# Patient Record
Sex: Male | Born: 2000 | Race: White | Hispanic: No | Marital: Single | Smoking: Never smoker
Health system: Southern US, Community
[De-identification: ages and names within clinical notes are randomized; demographics above are authoritative.]

## PROBLEM LIST (undated history)

## (undated) DIAGNOSIS — L709 Acne, unspecified: Secondary | ICD-10-CM

## (undated) DIAGNOSIS — R010 Benign and innocent cardiac murmurs: Secondary | ICD-10-CM

## (undated) DIAGNOSIS — T7840XA Allergy, unspecified, initial encounter: Secondary | ICD-10-CM

## (undated) HISTORY — DX: Acne, unspecified: L70.9

## (undated) HISTORY — DX: Allergy, unspecified, initial encounter: T78.40XA

## (undated) HISTORY — DX: Benign and innocent cardiac murmurs: R01.0

---
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
## 2019-03-05 DIAGNOSIS — Z5181 Encounter for therapeutic drug level monitoring: Secondary | ICD-10-CM | POA: Diagnosis not present

## 2019-03-15 DIAGNOSIS — Z5181 Encounter for therapeutic drug level monitoring: Secondary | ICD-10-CM | POA: Diagnosis not present

## 2019-03-22 DIAGNOSIS — Z5181 Encounter for therapeutic drug level monitoring: Secondary | ICD-10-CM | POA: Diagnosis not present

## 2019-03-26 DIAGNOSIS — Z5181 Encounter for therapeutic drug level monitoring: Secondary | ICD-10-CM | POA: Diagnosis not present

## 2019-04-02 DIAGNOSIS — Z5181 Encounter for therapeutic drug level monitoring: Secondary | ICD-10-CM | POA: Diagnosis not present

## 2019-04-09 DIAGNOSIS — Z5181 Encounter for therapeutic drug level monitoring: Secondary | ICD-10-CM | POA: Diagnosis not present

## 2019-04-16 DIAGNOSIS — Z5181 Encounter for therapeutic drug level monitoring: Secondary | ICD-10-CM | POA: Diagnosis not present

## 2019-04-23 DIAGNOSIS — Z5181 Encounter for therapeutic drug level monitoring: Secondary | ICD-10-CM | POA: Diagnosis not present

## 2019-04-30 DIAGNOSIS — Z5181 Encounter for therapeutic drug level monitoring: Secondary | ICD-10-CM | POA: Diagnosis not present

## 2019-05-07 DIAGNOSIS — Z5181 Encounter for therapeutic drug level monitoring: Secondary | ICD-10-CM | POA: Diagnosis not present

## 2019-05-16 DIAGNOSIS — Z5181 Encounter for therapeutic drug level monitoring: Secondary | ICD-10-CM | POA: Diagnosis not present

## 2019-05-24 DIAGNOSIS — Z5181 Encounter for therapeutic drug level monitoring: Secondary | ICD-10-CM | POA: Diagnosis not present

## 2019-05-31 DIAGNOSIS — Z5181 Encounter for therapeutic drug level monitoring: Secondary | ICD-10-CM | POA: Diagnosis not present

## 2019-06-07 DIAGNOSIS — Z5181 Encounter for therapeutic drug level monitoring: Secondary | ICD-10-CM | POA: Diagnosis not present

## 2019-06-11 DIAGNOSIS — Z5181 Encounter for therapeutic drug level monitoring: Secondary | ICD-10-CM | POA: Diagnosis not present

## 2019-06-20 DIAGNOSIS — Z5181 Encounter for therapeutic drug level monitoring: Secondary | ICD-10-CM | POA: Diagnosis not present

## 2019-06-23 ENCOUNTER — Emergency Department (HOSPITAL_COMMUNITY): Payer: Medicaid Other

## 2019-06-23 ENCOUNTER — Encounter (HOSPITAL_COMMUNITY): Payer: Self-pay | Admitting: Emergency Medicine

## 2019-06-23 ENCOUNTER — Other Ambulatory Visit: Payer: Self-pay

## 2019-06-23 ENCOUNTER — Emergency Department (HOSPITAL_COMMUNITY)
Admission: EM | Admit: 2019-06-23 | Discharge: 2019-06-23 | Discharge status: Against Medical Advice | Payer: Medicaid Other | Attending: Emergency Medicine | Admitting: Emergency Medicine

## 2019-06-23 DIAGNOSIS — Y998 Other external cause status: Secondary | ICD-10-CM | POA: Diagnosis not present

## 2019-06-23 DIAGNOSIS — L539 Erythematous condition, unspecified: Secondary | ICD-10-CM | POA: Insufficient documentation

## 2019-06-23 DIAGNOSIS — Y9389 Activity, other specified: Secondary | ICD-10-CM | POA: Insufficient documentation

## 2019-06-23 DIAGNOSIS — Z5320 Procedure and treatment not carried out because of patient's decision for unspecified reasons: Secondary | ICD-10-CM | POA: Insufficient documentation

## 2019-06-23 DIAGNOSIS — F1210 Cannabis abuse, uncomplicated: Secondary | ICD-10-CM | POA: Diagnosis not present

## 2019-06-23 DIAGNOSIS — T148XXA Other injury of unspecified body region, initial encounter: Secondary | ICD-10-CM

## 2019-06-23 DIAGNOSIS — F1410 Cocaine abuse, uncomplicated: Secondary | ICD-10-CM | POA: Insufficient documentation

## 2019-06-23 DIAGNOSIS — F10920 Alcohol use, unspecified with intoxication, uncomplicated: Secondary | ICD-10-CM | POA: Insufficient documentation

## 2019-06-23 DIAGNOSIS — Y92410 Unspecified street and highway as the place of occurrence of the external cause: Secondary | ICD-10-CM | POA: Insufficient documentation

## 2019-06-23 DIAGNOSIS — Z041 Encounter for examination and observation following transport accident: Secondary | ICD-10-CM | POA: Diagnosis present

## 2019-06-23 DIAGNOSIS — S59911A Unspecified injury of right forearm, initial encounter: Secondary | ICD-10-CM | POA: Diagnosis not present

## 2019-06-23 LAB — RAPID URINE DRUG SCREEN, HOSP PERFORMED
Amphetamines: NOT DETECTED
Barbiturates: NOT DETECTED
Benzodiazepines: NOT DETECTED
Cocaine: POSITIVE — AB
Opiates: NOT DETECTED
Tetrahydrocannabinol: POSITIVE — AB
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
## 2019-06-28 DIAGNOSIS — Z5181 Encounter for therapeutic drug level monitoring: Secondary | ICD-10-CM | POA: Diagnosis not present

## 2019-07-05 DIAGNOSIS — Z5181 Encounter for therapeutic drug level monitoring: Secondary | ICD-10-CM | POA: Diagnosis not present

## 2019-07-12 DIAGNOSIS — Z5181 Encounter for therapeutic drug level monitoring: Secondary | ICD-10-CM | POA: Diagnosis not present

## 2019-07-18 DIAGNOSIS — Z5181 Encounter for therapeutic drug level monitoring: Secondary | ICD-10-CM | POA: Diagnosis not present

## 2019-07-25 DIAGNOSIS — Z5181 Encounter for therapeutic drug level monitoring: Secondary | ICD-10-CM | POA: Diagnosis not present

## 2019-08-02 DIAGNOSIS — Z5181 Encounter for therapeutic drug level monitoring: Secondary | ICD-10-CM | POA: Diagnosis not present

## 2019-08-15 DIAGNOSIS — Z5181 Encounter for therapeutic drug level monitoring: Secondary | ICD-10-CM | POA: Diagnosis not present

## 2019-08-22 DIAGNOSIS — Z5181 Encounter for therapeutic drug level monitoring: Secondary | ICD-10-CM | POA: Diagnosis not present

## 2019-08-29 DIAGNOSIS — Z5181 Encounter for therapeutic drug level monitoring: Secondary | ICD-10-CM | POA: Diagnosis not present

## 2019-09-03 DIAGNOSIS — Z5181 Encounter for therapeutic drug level monitoring: Secondary | ICD-10-CM | POA: Diagnosis not present

## 2019-09-12 DIAGNOSIS — Z5181 Encounter for therapeutic drug level monitoring: Secondary | ICD-10-CM | POA: Diagnosis not present

## 2019-09-20 DIAGNOSIS — Z5181 Encounter for therapeutic drug level monitoring: Secondary | ICD-10-CM | POA: Diagnosis not present

## 2019-09-27 DIAGNOSIS — Z5181 Encounter for therapeutic drug level monitoring: Secondary | ICD-10-CM | POA: Diagnosis not present

## 2019-10-01 DIAGNOSIS — Z5181 Encounter for therapeutic drug level monitoring: Secondary | ICD-10-CM | POA: Diagnosis not present

## 2019-10-09 DIAGNOSIS — Z5181 Encounter for therapeutic drug level monitoring: Secondary | ICD-10-CM | POA: Diagnosis not present

## 2019-10-17 DIAGNOSIS — Z5181 Encounter for therapeutic drug level monitoring: Secondary | ICD-10-CM | POA: Diagnosis not present

## 2019-10-23 ENCOUNTER — Other Ambulatory Visit: Payer: Self-pay

## 2019-10-23 DIAGNOSIS — Z20828 Contact with and (suspected) exposure to other viral communicable diseases: Secondary | ICD-10-CM | POA: Diagnosis not present

## 2019-10-23 DIAGNOSIS — Z20822 Contact with and (suspected) exposure to covid-19: Secondary | ICD-10-CM

## 2019-10-25 DIAGNOSIS — Z5181 Encounter for therapeutic drug level monitoring: Secondary | ICD-10-CM | POA: Diagnosis not present

## 2019-10-25 LAB — NOVEL CORONAVIRUS, NAA: SARS-CoV-2, NAA: DETECTED — AB

## 2019-10-31 DIAGNOSIS — Z5181 Encounter for therapeutic drug level monitoring: Secondary | ICD-10-CM | POA: Diagnosis not present

## 2020-04-09 ENCOUNTER — Ambulatory Visit: Payer: Medicaid Other | Admitting: Family Medicine
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
## 2021-12-25 DIAGNOSIS — R091 Pleurisy: Secondary | ICD-10-CM | POA: Diagnosis not present

## 2024-10-02 ENCOUNTER — Ambulatory Visit
